# Patient Record
Sex: Male | Born: 1955 | Race: White | Hispanic: No | Marital: Married | Smoking: Former smoker
Health system: Southern US, Community
[De-identification: ages and names within clinical notes are randomized; demographics above are authoritative.]

---

## 2014-10-15 ENCOUNTER — Inpatient Hospital Stay: Admit: 2014-10-15 | Payer: Self-pay | Source: Other Acute Inpatient Hospital | Admitting: Internal Medicine

## 2015-12-15 ENCOUNTER — Encounter: Payer: Self-pay | Admitting: Gastroenterology

## 2016-03-18 ENCOUNTER — Encounter: Payer: Self-pay | Admitting: Sports Medicine

## 2016-03-18 ENCOUNTER — Encounter (INDEPENDENT_AMBULATORY_CARE_PROVIDER_SITE_OTHER): Payer: Self-pay

## 2016-03-18 ENCOUNTER — Ambulatory Visit (INDEPENDENT_AMBULATORY_CARE_PROVIDER_SITE_OTHER): Payer: PRIVATE HEALTH INSURANCE

## 2016-03-18 ENCOUNTER — Ambulatory Visit (INDEPENDENT_AMBULATORY_CARE_PROVIDER_SITE_OTHER): Payer: PRIVATE HEALTH INSURANCE | Admitting: Sports Medicine

## 2016-03-18 DIAGNOSIS — M79671 Pain in right foot: Secondary | ICD-10-CM

## 2016-03-18 DIAGNOSIS — M779 Enthesopathy, unspecified: Secondary | ICD-10-CM

## 2016-03-18 DIAGNOSIS — M2041 Other hammer toe(s) (acquired), right foot: Secondary | ICD-10-CM

## 2016-03-18 MED ORDER — TRIAMCINOLONE ACETONIDE 40 MG/ML IJ SUSP: 20.0000 mg | Freq: Once | INTRAMUSCULAR | Status: AC

## 2016-03-18 NOTE — Progress Notes (Addendum)
Subjective: John Nash is a 60 y.o. male patient who presents to office for evaluation of Right foot pain. Patient complains of progressive pain especially since end of June. Reports that it feels like he is walking on a marble and that there is stiffness to toes. Patient states that pain is worse with walking on hard surfaces. States that he has tried pulsating shower, using his air hose at work, change in shoes and some pads with no relief in symptoms. Patient also admits to a history of dropping things on foot however, no recent trauma or injury that he can recall that could be contributing to this pain. Patient denies any other pedal complaints.   There are no active problems to display for this patient.   No current outpatient prescriptions on file prior to visit.   No current facility-administered medications on file prior to visit.     No Known Allergies  Objective:  General: Alert and oriented x3 in no acute distress  Dermatology: No Callus or open lesions bilateral lower extremities, no webspace macerations, no ecchymosis bilateral, all nails x 10 are well manicured.  Vascular: Dorsalis Pedis and Posterior Tibial pedal pulses 2/4, Capillary Fill Time 3 seconds,(+) pedal hair growth bilateral, no edema bilateral lower extremities, Temperature gradient within normal limits.  Neurology: [name] sensation intact via light touch bilateral, negative Tinels sign bilateral.   Musculoskeletal: Semi-flexible hammertoes 2-5 with the most contracted digit being the right second toe with Mild tenderness with palpation at metatarsal phalangeal joint plantar aspect. Ankle, Subtalar, Midtarsal, and MTPJ joint range of motion is within normal limits, there is no 1st ray hypermobility noted bilateral, No bunion deformity noted bilateral. No pain with calf compression bilateral.  Strength within normal limits in all groups bilateral.   Xrays  Right Foot    Impression: Normal osseous  mineralization. There is hammertoe deformity with 2nd hammertoe being most contracted. There is posterior and inferior calcaneal spurs. Supinated position of foot. Soft tissue margins within normal limits. No other acute findings.       Assessment and Plan: Problem List Items Addressed This Visit    None    Visit Diagnoses    Capsulitis    -  Primary   Relevant Medications   triamcinolone acetonide (KENALOG-40) injection 20 mg (Start on 03/18/2016 10:45 AM)   Right foot pain       Relevant Medications   triamcinolone acetonide (KENALOG-40) injection 20 mg (Start on 03/18/2016 10:45 AM)   Other Relevant Orders   DG Foot 2 Views Right   Hammertoe, right       Relevant Medications   triamcinolone acetonide (KENALOG-40) injection 20 mg (Start on 03/18/2016 10:45 AM)       -Complete examination performed -Xrays reviewed -Discussed treatement options for likely capsulitis secondary to hammertoe deformity -After oral consent and aseptic prep, injected a mixture containing 1 ml of 2%  plain lidocaine, 1 ml 0.5% plain marcaine, 0.5 ml of kenalog 40 and 0.5 ml of dexamethasone phosphate into right second metatarsophalangeal joint dorsal approach without complication. Post-injection care discussed with patient.  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 Advised patient if this works will benefit long-term from orthotic management -Recommend icing daily -Recommend good supportive shoes -Patient to return to office as needed or sooner if condition worsens.  Landis Martins, DPM

## 2016-11-05 ENCOUNTER — Ambulatory Visit (INDEPENDENT_AMBULATORY_CARE_PROVIDER_SITE_OTHER): Payer: PRIVATE HEALTH INSURANCE | Admitting: Sports Medicine

## 2016-11-05 ENCOUNTER — Ambulatory Visit (INDEPENDENT_AMBULATORY_CARE_PROVIDER_SITE_OTHER): Payer: PRIVATE HEALTH INSURANCE

## 2016-11-05 ENCOUNTER — Encounter: Payer: Self-pay | Admitting: Sports Medicine

## 2016-11-05 VITALS — BP 115/84 | HR 78 | Resp 18

## 2016-11-05 DIAGNOSIS — M7671 Peroneal tendinitis, right leg: Secondary | ICD-10-CM

## 2016-11-05 DIAGNOSIS — R52 Pain, unspecified: Secondary | ICD-10-CM

## 2016-11-05 DIAGNOSIS — M79671 Pain in right foot: Secondary | ICD-10-CM

## 2016-11-05 DIAGNOSIS — M7751 Other enthesopathy of right foot: Secondary | ICD-10-CM | POA: Diagnosis not present

## 2016-11-05 DIAGNOSIS — M779 Enthesopathy, unspecified: Secondary | ICD-10-CM

## 2016-11-05 DIAGNOSIS — M7661 Achilles tendinitis, right leg: Secondary | ICD-10-CM

## 2016-11-05 MED ORDER — TRIAMCINOLONE ACETONIDE 10 MG/ML IJ SUSP: 10.0000 mg | Freq: Once | INTRAMUSCULAR | Status: AC

## 2016-11-05 NOTE — Progress Notes (Signed)
Subjective: John Nash is a 61 y.o. male patient who returns to office for evaluation of Right foot pain. Patient states new pain at base of 5th metatarsal that is bothering him. Denies injury. Patient denies any other pedal complaints.   There are no active problems to display for this patient.   Current Outpatient Prescriptions on File Prior to Visit  Medication Sig Dispense Refill  . ALPRAZolam (XANAX) 0.25 MG tablet TK 1 T PO BID  2  . ALPRAZolam (XANAX) 0.25 MG tablet Take 0.5 mg by mouth.    . B Complex Vitamins (B-COMPLEX/B-12 PO) Take 1,000 mg by mouth.    . Cholecalciferol (VITAMIN D-1000 MAX ST) 1000 units tablet Take by mouth.    . Cyanocobalamin (RA VITAMIN B-12 TR) 1000 MCG TBCR Take by mouth.    . ezetimibe (ZETIA) 10 MG tablet Take 20 mg by mouth.     . Multiple Vitamins-Minerals (CENTRUM SILVER 50+MEN PO) Take by mouth.    . rivaroxaban (XARELTO) 20 MG TABS tablet Take 20 mg by mouth daily with supper.     Current Facility-Administered Medications on File Prior to Visit  Medication Dose Route Frequency Provider Last Rate Last Dose  . triamcinolone acetonide (KENALOG-40) injection 20 mg  20 mg Other Once John Nash, DPM        No Known Allergies  Objective:  General: Alert and oriented x3 in no acute distress  Dermatology: No Callus or open lesions bilateral lower extremities, no webspace macerations, no ecchymosis bilateral, all nails x 10 are well manicured.  Vascular: Dorsalis Pedis and Posterior Tibial pedal pulses 2/4, Capillary Fill Time 3 seconds,(+) pedal hair growth bilateral, no edema bilateral lower extremities, Temperature gradient within normal limits.  Neurology: John Nash sensation intact via light touch bilateral, negative Tinels sign bilateral.   Musculoskeletal: Semi-flexible hammertoes 2-5 with the most contracted digit being the right second toe with Mild tenderness with palpation at base of 5th met on right. Ankle, Subtalar, Midtarsal, and  MTPJ joint range of motion is within normal limits, there is no 1st ray hypermobility noted bilateral, No bunion deformity noted bilateral. No pain with calf compression bilateral.  Strength within normal limits in all groups bilateral.         Assessment and Plan: Problem List Items Addressed This Visit    None    Visit Diagnoses    Pain    -  Primary   Relevant Medications   triamcinolone acetonide (KENALOG) 10 MG/ML injection 10 mg   Other Relevant Orders   DG Foot Complete Right   Capsulitis       Relevant Medications   triamcinolone acetonide (KENALOG) 10 MG/ML injection 10 mg   Right foot pain       Relevant Medications   triamcinolone acetonide (KENALOG) 10 MG/ML injection 10 mg   Peroneal tendonitis, right       Relevant Medications   triamcinolone acetonide (KENALOG) 10 MG/ML injection 10 mg      -Complete examination performed -Precious Xrays reviewed -Discussed treatement options for likely capsulitis secondary to tendonitis -After oral consent and aseptic prep, injected a mixture containing 1 ml of 2%  plain lidocaine, 1 ml 0.5% plain marcaine, 0.5 ml of kenalog 40 and 0.5 ml of dexamethasone phosphate into right 5th met base approach without complication. Post-injection care discussed with patient.  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 Advised patient if this works will benefit long-term from orthotic management -Recommend icing daily -Recommend good supportive shoes -Patient to return to office as needed or sooner if condition worsens.  Landis Martins, DPM

## 2017-04-04 ENCOUNTER — Encounter: Payer: Self-pay | Admitting: Internal Medicine

## 2017-04-04 ENCOUNTER — Ambulatory Visit (INDEPENDENT_AMBULATORY_CARE_PROVIDER_SITE_OTHER): Payer: BLUE CROSS/BLUE SHIELD | Admitting: Internal Medicine

## 2017-04-04 VITALS — BP 144/74 | HR 83 | Ht 74.0 in | Wt 232.0 lb

## 2017-04-04 DIAGNOSIS — R0609 Other forms of dyspnea: Secondary | ICD-10-CM

## 2017-04-04 DIAGNOSIS — R0600 Dyspnea, unspecified: Secondary | ICD-10-CM | POA: Insufficient documentation

## 2017-04-04 DIAGNOSIS — R911 Solitary pulmonary nodule: Secondary | ICD-10-CM

## 2017-04-04 DIAGNOSIS — I871 Compression of vein: Secondary | ICD-10-CM | POA: Diagnosis not present

## 2017-04-04 NOTE — Assessment & Plan Note (Signed)
Spirometry 04/04/2017  FEV1 2.27 (56%)  Ratio 72 with very minimal curvature on no rx with large airway "rhonchi" on exam c/w extrinsic compression of LMSB on CT from 03/31/17    The "wheezing" heard on exam is much more prominent over the L chest and is described as positional by pt and wife so nothing to be gained at this point with inhalers as overall has no evidence of airflow obstruction

## 2017-04-04 NOTE — Assessment & Plan Note (Addendum)
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 any kind of lung or node bx in the chest and he is on xarelto, surrendipitously also has filter, so will need to be off for 2-3 days before bx can be considered > liver looks like best bet for now pending PET  Discussed in detail all the  indications, usual  risks and alternatives  relative to the benefits with patient who agrees to proceed with w/u as outlined     Total time devoted to counseling  > 50 % of initial 60 min office visit:  review case with pt/wife discussion of options/alternatives/ personally creating written customized instructions  in presence of pt  then going over those specific  Instructions directly with the pt including how to use all of the meds but in particular covering each new medication in detail and the difference between the maintenance= "automatic" meds and the prns using an action plan format for the latter (If this problem/symptom => do that organization reading Left to right).  Please see AVS from this visit for a full list of these instructions which I personally wrote for this pt and  are unique to this visit.

## 2017-04-04 NOTE — Patient Instructions (Addendum)
Please see patient coordinator before you leave today  to schedule PET scan  asap and I will call you with the results to plan the next step

## 2017-04-04 NOTE — Progress Notes (Signed)
Subjective:     Patient ID: John Nash, male   DOB: Aug 10, 1955,    MRN: [id number]  HPI  32 yowm quit smoking 2002 p grand-daughter told him to while still perfectly healthy  then PE 2016 presented with sob pre-syncope dx with saddle embolism and familial clotting problem  (? 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 Pulmonary Consult    Referred by Dr. Dorothea Glassman. Pt c/o DOE x 2 months- occurs with exertion such as walking a brisk pace or taking a shower. He also c/o non prod cough. He hears some wheezing at night that comes and goes.   had dvt both legs on u/s @ dx of PE s assoc symptoms and underwent ivc filter at original dx of pe and has had intermittent bilateral  leg swelling since then no worse lateley with main cc = progressively worse  doe = MMRC3 = can't walk 100 yards even at a slow pace at a flat grade s stopping due to sob  Assoc with dry cough and audible noct wheeze intermittently / has not tried any inhalers/ wheezing seems positional  No obvious day to day or daytime variability or assoc excess/ purulent sputum or mucus plugs or hemoptysis or cp or chest tightness, subjective wheeze or overt sinus or hb symptoms. No unusual exp hx or h/o childhood pna/ asthma or knowledge of premature birth.  Sleeping ok flat without nocturnal  or early am exacerbation  of respiratory  c/o's or need for noct saba. Also denies any obvious fluctuation of symptoms with weather or environmental changes or other aggravating or alleviating factors  except as outlined above   Current Allergies, Complete Past Medical History, Past Surgical History, Family History, and Social History were reviewed in [employer] record.  ROS  The following are not active complaints unless bolded sore throat, dysphagia, dental problems, itching, sneezing,  nasal congestion or disharge of excess mucus or purulent secretions, ear ache,   fever, chills, sweats, unintended wt loss or wt gain, classically pleuritic or exertional cp,  orthopnea pnd or leg swelling, presyncope, palpitations, abdominal pain, anorexia, nausea, vomiting, diarrhea  or change in bowel habits or bladder habits, change in stools or change in urine, dysuria, hematuria,  rash, arthralgias, visual complaints, headache, numbness, weakness or ataxia or problems with walking or coordination,  change in mood/affect or memory.        Current Meds  Medication Sig  . acetaminophen (TYLENOL) 325 MG tablet Take 650 mg by mouth every 6 (six) hours as needed.  . ALPRAZolam (XANAX) 0.25 MG tablet Take 0.5 mg by mouth 2 (two) times daily as needed.   . cyanocobalamin 1000 MCG tablet Take 1,000 mcg by mouth daily.  Marland Kitchen ezetimibe (ZETIA) 10 MG tablet Take 10 mg by mouth daily.   . Multiple Vitamins-Minerals (CENTRUM SILVER 50+MEN PO) Take by mouth.  . rivaroxaban (XARELTO) 20 MG TABS tablet Take 20 mg by mouth daily with supper.   Current Facility-Administered Medications for the  04/04/17 encounter (Office Visit) with Tanda Rockers, MD  Medication  . triamcinolone acetonide (KENALOG) 10 MG/ML injection 10 mg  . triamcinolone acetonide (KENALOG-40) injection 20 mg         Review of Systems     Objective:   Physical Exam    amb wm nad   Wt Readings from Last 3 Encounters:  04/04/17 232 lb (105.2 kg)    Vital signs reviewed - Note on arrival 02 sats  98% on RA    HEENT: nl dentition, turbinates bilaterally, and oropharynx. Nl external ear canals without cough  reflex   NECK :  without JVD/Nodes/TM/ nl carotid upstrokes bilaterally - very prominent sts base of neck R = L    LUNGS: no acc muscle use,  Nl contour chest with coarse large airway sounding insp /exp rhonchi L >> R   CV:  RRR  no s3 or murmur or increase in P2, and trace pitting sym LE edema bilaterally   ABD:  soft and nontender with nl inspiratory excursion in the supine position. No bruits or organomegaly appreciated, bowel sounds nl  MS:  Nl gait/ ext warm without deformities, calf tenderness, cyanosis  - No clubbing No obvious joint restrictions   SKIN: warm and dry without lesions    NEURO:  alert, approp, nl sensorium with  no motor or cerebellar deficits apparent.      I personally reviewed images and agree with radiology impression as follows:   Chest CT 03/31/17  Bilateral hilar adenopathy with compression of LMSB and MPNS and Multiple liver nodules      Assessment:

## 2017-04-12 ENCOUNTER — Telehealth: Payer: Self-pay | Admitting: Internal Medicine

## 2017-04-12 NOTE — Telephone Encounter (Signed)
Rec'd cancer asssesment from from [facility] via fax - fwd to [employer] via interoffice mail -04/12/17-pr

## 2017-04-13 ENCOUNTER — Ambulatory Visit (HOSPITAL_COMMUNITY)
Admission: RE | Admit: 2017-04-13 | Discharge: 2017-04-13 | Disposition: A | Discharge status: Home / Self Care | Payer: BLUE CROSS/BLUE SHIELD | Source: Ambulatory Visit | Attending: Internal Medicine | Admitting: Internal Medicine

## 2017-04-13 DIAGNOSIS — C7951 Secondary malignant neoplasm of bone: Secondary | ICD-10-CM | POA: Diagnosis not present

## 2017-04-13 DIAGNOSIS — R918 Other nonspecific abnormal finding of lung field: Secondary | ICD-10-CM | POA: Insufficient documentation

## 2017-04-13 DIAGNOSIS — R590 Localized enlarged lymph nodes: Secondary | ICD-10-CM | POA: Diagnosis not present

## 2017-04-13 DIAGNOSIS — I871 Compression of vein: Secondary | ICD-10-CM | POA: Diagnosis not present

## 2017-04-13 DIAGNOSIS — C787 Secondary malignant neoplasm of liver and intrahepatic bile duct: Secondary | ICD-10-CM | POA: Diagnosis not present

## 2017-04-13 DIAGNOSIS — C801 Malignant (primary) neoplasm, unspecified: Secondary | ICD-10-CM | POA: Insufficient documentation

## 2017-04-13 DIAGNOSIS — R911 Solitary pulmonary nodule: Secondary | ICD-10-CM

## 2017-04-13 LAB — GLUCOSE, CAPILLARY: GLUCOSE-CAPILLARY: 98 mg/dL (ref 65–99)

## 2017-04-13 MED ORDER — FLUDEOXYGLUCOSE F - 18 (FDG) INJECTION
11.8000 | Freq: Once | INTRAVENOUS | Status: AC
  Administered 2017-04-13: 11.8 via INTRAVENOUS

## 2017-04-15 ENCOUNTER — Telehealth: Payer: Self-pay | Admitting: Internal Medicine

## 2017-04-15 DIAGNOSIS — I871 Compression of vein: Secondary | ICD-10-CM

## 2017-04-15 NOTE — Progress Notes (Signed)
Order sent to [facility] 

## 2017-04-15 NOTE — Telephone Encounter (Signed)
Note plan was to order bx as of 04/13/17 but we had to [facility] . Try again

## 2017-04-15 NOTE — Telephone Encounter (Signed)
Spoke with pt's wife and she states she has not heard about an appt for a biopsy for pt. She states MW wanted this done as soon as possible but they haven't heard anything. MW was this ordered? I don't see the order for this. Please advise.

## 2017-04-15 NOTE — Telephone Encounter (Signed)
Biopsy ordered STAT and pt's spouse made aware  She was advised that the pt can take xarelto today and tomorrow

## 2017-04-18 NOTE — Telephone Encounter (Signed)
Rec'd disability forms ([employer]) from [employer] - fwd to [place] for MW to complete -pr

## 2017-04-19 ENCOUNTER — Other Ambulatory Visit: Payer: Self-pay | Admitting: Student

## 2017-04-19 ENCOUNTER — Other Ambulatory Visit: Payer: Self-pay | Admitting: General Surgery

## 2017-04-20 ENCOUNTER — Ambulatory Visit (HOSPITAL_COMMUNITY)
Admission: RE | Admit: 2017-04-20 | Discharge: 2017-04-20 | Disposition: A | Discharge status: Home / Self Care | Payer: BLUE CROSS/BLUE SHIELD | Source: Ambulatory Visit | Attending: Internal Medicine | Admitting: Internal Medicine

## 2017-04-20 ENCOUNTER — Encounter (HOSPITAL_COMMUNITY): Payer: Self-pay

## 2017-04-20 DIAGNOSIS — D689 Coagulation defect, unspecified: Secondary | ICD-10-CM | POA: Diagnosis not present

## 2017-04-20 DIAGNOSIS — C7A1 Malignant poorly differentiated neuroendocrine tumors: Secondary | ICD-10-CM | POA: Diagnosis not present

## 2017-04-20 DIAGNOSIS — R590 Localized enlarged lymph nodes: Secondary | ICD-10-CM | POA: Insufficient documentation

## 2017-04-20 DIAGNOSIS — Z87891 Personal history of nicotine dependence: Secondary | ICD-10-CM | POA: Insufficient documentation

## 2017-04-20 DIAGNOSIS — R221 Localized swelling, mass and lump, neck: Secondary | ICD-10-CM | POA: Insufficient documentation

## 2017-04-20 DIAGNOSIS — C7B00 Secondary carcinoid tumors, unspecified site: Secondary | ICD-10-CM | POA: Insufficient documentation

## 2017-04-20 DIAGNOSIS — C787 Secondary malignant neoplasm of liver and intrahepatic bile duct: Secondary | ICD-10-CM | POA: Diagnosis not present

## 2017-04-20 DIAGNOSIS — R0602 Shortness of breath: Secondary | ICD-10-CM | POA: Insufficient documentation

## 2017-04-20 DIAGNOSIS — I871 Compression of vein: Secondary | ICD-10-CM | POA: Diagnosis present

## 2017-04-20 DIAGNOSIS — R918 Other nonspecific abnormal finding of lung field: Secondary | ICD-10-CM | POA: Insufficient documentation

## 2017-04-20 LAB — CBC
HEMATOCRIT: 41.2 % (ref 39.0–52.0)
HEMOGLOBIN: 13.1 g/dL (ref 13.0–17.0)
MCH: 26.5 pg (ref 26.0–34.0)
MCHC: 31.8 g/dL (ref 30.0–36.0)
MCV: 83.2 fL (ref 78.0–100.0)
Platelets: 145 10*3/uL — ABNORMAL LOW (ref 150–400)
RBC: 4.95 MIL/uL (ref 4.22–5.81)
RDW: 13.2 % (ref 11.5–15.5)
WBC: 5.8 10*3/uL (ref 4.0–10.5)

## 2017-04-20 LAB — PROTIME-INR
INR: 0.95
PROTHROMBIN TIME: 12.6 s (ref 11.4–15.2)

## 2017-04-20 MED ORDER — MIDAZOLAM HCL 2 MG/2ML IJ SOLN
INTRAMUSCULAR | Status: AC
  Filled 2017-04-20: qty 2

## 2017-04-20 MED ORDER — SODIUM CHLORIDE 0.9 % IV SOLN
INTRAVENOUS | Status: DC
  Administered 2017-04-20: 13:00:00 via INTRAVENOUS

## 2017-04-20 MED ORDER — MIDAZOLAM HCL 2 MG/2ML IJ SOLN
INTRAMUSCULAR | Status: AC | PRN
  Administered 2017-04-20: 1 mg via INTRAVENOUS

## 2017-04-20 MED ORDER — LIDOCAINE HCL (PF) 1 % IJ SOLN
INTRAMUSCULAR | Status: AC
  Filled 2017-04-20: qty 10

## 2017-04-20 MED ORDER — FENTANYL CITRATE (PF) 100 MCG/2ML IJ SOLN
INTRAMUSCULAR | Status: AC
  Filled 2017-04-20: qty 2

## 2017-04-20 MED ORDER — FENTANYL CITRATE (PF) 100 MCG/2ML IJ SOLN
INTRAMUSCULAR | Status: AC | PRN
Start: 2017-04-20 — End: 2017-04-20
  Administered 2017-04-20 (×2): 50 ug via INTRAVENOUS

## 2017-04-20 NOTE — Sedation Documentation (Signed)
Patient is resting comfortably. 

## 2017-04-20 NOTE — Procedures (Signed)
Interventional Radiology Procedure Note  Procedure: US guided core biopsy of right supraclavicular lymph node  Complications: None  Estimated Blood Loss: < 10 mL  Enlarged right supraclavicular LN sampled with 18 G core biopsy x 4.  Samples sent in saline.  Venetia Night. Kathlene Cote, M.D Pager:  [phone]

## 2017-04-20 NOTE — Sedation Documentation (Signed)
Patient is resting comfortably. Denies pain 0/10

## 2017-04-20 NOTE — Discharge Instructions (Signed)

## 2017-04-20 NOTE — H&P (Signed)
Chief Complaint: Patient was seen in consultation today for lymphadenopathy  Referring Physician(s): Wert,Michael B  Supervising Physician: Aletta Edouard  Patient Status: [facility] - Out-pt  History of Present Illness: John Nash is a 61 y.o. 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  CT scan performed at OSH shows lymphadenopathy and lung nodules.   PET 04/13/17: 1. Examination is positive for extensive hypermetabolic adenopathy within the neck, chest, and upper abdomen. 2. Hypermetabolic pulmonary nodules are identified within the left upper lobe. 3. Extensive liver metastasis 4. Diffuse hypermetabolic bone metastasis.   IR consulted for possible biopsy at the request of Dr. Melvyn Novas.  Case reviewed and approved for cervical lymph node biopsy by Dr. Earleen Newport.   Patient presents for procedure today in his usual state of health.  Patient has been NPO.  He does take Xarelto but has held it for the past 2 days.   History reviewed. No pertinent past medical history.  History reviewed. No pertinent surgical history.  Allergies: Statins  Medications: Prior to Admission medications   Medication Sig Start Date End Date Taking? Authorizing Provider  acetaminophen (TYLENOL) 500 MG tablet Take 1,000 mg by mouth every 6 (six) hours as needed (FOR PAIN.).   Yes [provider]  ALPRAZolam Duanne Moron) 0.5 MG tablet Take 0.5 mg by mouth 3 (three) times daily as needed for anxiety.   Yes [provider]  cyanocobalamin (,VITAMIN B-12,) 1000 MCG/ML injection Inject 1,000 mcg into the muscle every 30 (thirty) days.   Yes [provider]  cyanocobalamin 1000 MCG tablet Take 1,000 mcg by mouth daily.   Yes [provider]  ezetimibe (ZETIA) 10 MG tablet Take 10 mg by mouth daily.    Yes [provider]  Menthol,  Topical Analgesic, (BLUE-EMU MAXIMUM STRENGTH EX) Apply 1 application topically 3 (three) times daily as needed (for back pain.).   Yes [provider]  Menthol, Topical Analgesic, (ICY HOT EX) Apply 1-2 application topically 3 (three) times daily as needed (for back pain.).   Yes [provider]  Multiple Vitamin (MULTIVITAMIN WITH MINERALS) TABS tablet Take 1 tablet by mouth daily. CENTRUM SILVER   Yes [provider]  oxyCODONE-acetaminophen (PERCOCET/ROXICET) 5-325 MG tablet Take 1-2 tablets by mouth every 6 (six) hours as needed (for pain.).   Yes [provider]  rivaroxaban (XARELTO) 20 MG TABS tablet Take 20 mg by mouth daily with supper.   Yes [provider]     History reviewed. No pertinent family history.  Social History   Social History  . Marital status: Married    Spouse name: N/A  . Number of children: N/A  . Years of education: N/A   Social History Main Topics  . Smoking status: Former Smoker    Packs/day: 1.00    Years: 30.00    Types: Cigarettes    Quit date: 07/19/2000  . Smokeless tobacco: Never Used  . Alcohol use None  . Drug use: Unknown  . Sexual activity: Not Asked   Other Topics Concern  . None   Social History Narrative  . None    Review of Systems  Constitutional: Negative for fatigue and fever.  Respiratory: Positive for cough (chronic) and shortness of breath (chronic).   Cardiovascular: Negative for chest pain.  Gastrointestinal: Negative for abdominal pain.  Hematological: Positive for adenopathy.  Psychiatric/Behavioral: Negative for  behavioral problems and confusion.    Vital Signs: BP (!) 172/92 (BP Location: Right Arm)   Pulse 84   Temp 98.7 F (37.1 C) (Oral)   Ht 6\' 2"  (1.88 m)   Wt 226 lb (102.5 kg)   SpO2 96%   BMI 29.02 kg/m   Physical Exam  Constitutional: He appears well-developed.  Neck:  Large area of swelling/enhancement superior to right clavicle.   Cardiovascular:  Normal rate, regular rhythm and normal heart sounds.   Pulmonary/Chest: Effort normal and breath sounds normal. No respiratory distress.  Abdominal: Soft.  Nursing note and vitals reviewed.   Imaging: Nm Pet Image Initial (pi) Skull Base To Thigh  Result Date: 04/13/2017 CLINICAL DATA:  Initial treatment strategy for pulmonary nodule. EXAM: NUCLEAR MEDICINE PET SKULL BASE TO THIGH TECHNIQUE: 11.8 mCi F-18 FDG was injected intravenously. Full-ring PET imaging was performed from the skull base to thigh after the radiotracer. CT data was obtained and used for attenuation correction and anatomic localization. FASTING BLOOD GLUCOSE:  Value: 98 mg/dl COMPARISON:  None. FINDINGS: NECK: Extensive hypermetabolic bilateral cervical adenopathy identified. Right posterior triangle node measures 1.5 cm and has an SUV max equal to 12.4. There is a left level 3 node which measures 1.2 cm and has an SUV max equal to 7.95, image 41 of series 4. CHEST: Extensive hypermetabolic mediastinal and bilateral hilar adenopathy. Index right paratracheal node measures 1.5 cm and has an SUV max equal to 18.95. Hypermetabolic left paratracheal node measures 1.7 cm and has an SUV max equal to 16.9. Sub- carinal lymph node mass measures 2.9 cm and has an SUV max equal to 26.37. Index left hilar mass measures 4.7 cm and has an SUV max equal to 18.08. Right hilar nodal mass measures 4.8 cm and has an SUV max equal to 20.5. Two nodules within the left upper lobe are identified and hypermetabolic. The largest measures 1.6 cm and has an SUV max equal to 15.2. ABDOMEN/PELVIS: Extensive hypermetabolic liver metastases are identified. Index lesion along the dome measures 4.4 cm and has an SUV max equal to 21. Confluent mass within the medial segment of the left lobe measures 4.8 cm and has an SUV max equal to 23.4. Multiple hypermetabolic nodes are identified within the upper abdomen. Index aortocaval node measures 1.1 cm and has an SUV max equal  to 23.16. Retrocaval node measures 0.8 cm and has an SUV max equal to 20.76. There is no abnormal activity within the pancreas or spleen. Unremarkable appearance of the kidneys. A filter is identified within the inferior vena cava. Aortic atherosclerosis without aneurysm. SKELETON: Extensive multifocal hypermetabolic bone metastases identified. Large lesion involving nearly the entire L3 vertebra has an SUV max equal to 18.66. Hypermetabolic lesion involving approximately 50% of the T7 vertebra has an SUV max equal to 9.11. Lesion within the right side of the sternal manubrium has an SUV max equal to 6.4. IMPRESSION: 1. Examination is positive for extensive hypermetabolic adenopathy within the neck, chest, and upper abdomen. 2. Hypermetabolic pulmonary nodules are identified within the left upper lobe. 3. Extensive liver metastasis 4. Diffuse hypermetabolic bone metastasis. Electronically Signed   By: Kerby Moors M.D.   On: 04/13/2017 10:58    Labs:  CBC:  Recent Labs  04/20/17 1137  WBC 5.8  HGB 13.1  HCT 41.2  PLT 145*    COAGS:  Recent Labs  04/20/17 1137  INR 0.95    BMP: No results for input(s): NA, K, CL, CO2, GLUCOSE, BUN,  CALCIUM, CREATININE, GFRNONAA, GFRAA in the last 8760 hours.  Invalid input(s): CMP  LIVER FUNCTION TESTS: No results for input(s): BILITOT, AST, ALT, ALKPHOS, PROT, ALBUMIN in the last 8760 hours.  TUMOR MARKERS: No results for input(s): AFPTM, CEA, CA199, CHROMGRNA in the last 8760 hours.  Assessment and Plan: Patient with past medical history of familial clotting disorder presents with complaint of shortness of breath and recent findings on CT scan suspicious for metastatic disease of lung primary.  IR consulted for lymph node biopsy at the request of Dr. Melvyn Novas. Case reviewed by Dr. Earleen Newport who approves patient for procedure.  Patient presents today in their usual state of health.  He has been NPO and has appropriately held his blood thinners.    Risks and benefits discussed with the patient including, but not limited to bleeding, infection, damage to adjacent structures or low yield requiring additional tests. All of the patient's questions were answered, patient is agreeable to proceed. Consent signed and in chart.    Thank you for this interesting consult.  I greatly enjoyed meeting John Nash and look forward to participating in their care.  A copy of this report was sent to the requesting provider on this date.  Electronically Signed: Docia Barrier, PA 04/20/2017, 12:31 PM   I spent a total of  30 Minutes   in face to face in clinical consultation, greater than 50% of which was counseling/coordinating care for lymphadenopathy

## 2017-04-25 ENCOUNTER — Other Ambulatory Visit: Payer: Self-pay | Admitting: Internal Medicine

## 2017-04-25 DIAGNOSIS — I871 Compression of vein: Secondary | ICD-10-CM

## 2017-04-25 NOTE — Progress Notes (Signed)
Discussed u/s bx dx with wife > oncology next stop but if condition worsens > go to [facility] ER

## 2017-04-26 NOTE — Telephone Encounter (Signed)
Rec'd completed forms back from [place] to [employer] via interoffice mail - 04/26/17 -pr

## 2017-04-28 DIAGNOSIS — Z0001 Encounter for general adult medical examination with abnormal findings: Secondary | ICD-10-CM

## 2017-04-28 DIAGNOSIS — C3490 Malignant neoplasm of unspecified part of unspecified bronchus or lung: Secondary | ICD-10-CM | POA: Diagnosis not present

## 2017-04-28 DIAGNOSIS — C7951 Secondary malignant neoplasm of bone: Secondary | ICD-10-CM | POA: Diagnosis not present

## 2017-04-28 DIAGNOSIS — C787 Secondary malignant neoplasm of liver and intrahepatic bile duct: Secondary | ICD-10-CM | POA: Diagnosis not present

## 2017-04-29 ENCOUNTER — Institutional Professional Consult (permissible substitution): Payer: Self-pay | Admitting: Internal Medicine

## 2017-05-09 DIAGNOSIS — Z0001 Encounter for general adult medical examination with abnormal findings: Secondary | ICD-10-CM | POA: Diagnosis not present

## 2017-07-13 DIAGNOSIS — Z006 Encounter for examination for normal comparison and control in clinical research program: Secondary | ICD-10-CM | POA: Diagnosis not present

## 2017-07-13 DIAGNOSIS — C3490 Malignant neoplasm of unspecified part of unspecified bronchus or lung: Secondary | ICD-10-CM | POA: Diagnosis not present

## 2017-07-13 DIAGNOSIS — C7951 Secondary malignant neoplasm of bone: Secondary | ICD-10-CM | POA: Diagnosis not present

## 2017-07-13 DIAGNOSIS — C787 Secondary malignant neoplasm of liver and intrahepatic bile duct: Secondary | ICD-10-CM | POA: Diagnosis not present

## 2017-08-05 DIAGNOSIS — Z006 Encounter for examination for normal comparison and control in clinical research program: Secondary | ICD-10-CM | POA: Diagnosis not present

## 2017-08-05 DIAGNOSIS — C7951 Secondary malignant neoplasm of bone: Secondary | ICD-10-CM | POA: Diagnosis not present

## 2017-08-05 DIAGNOSIS — C3490 Malignant neoplasm of unspecified part of unspecified bronchus or lung: Secondary | ICD-10-CM | POA: Diagnosis not present

## 2017-08-05 DIAGNOSIS — C787 Secondary malignant neoplasm of liver and intrahepatic bile duct: Secondary | ICD-10-CM | POA: Diagnosis not present

## 2017-08-22 DIAGNOSIS — C3490 Malignant neoplasm of unspecified part of unspecified bronchus or lung: Secondary | ICD-10-CM | POA: Diagnosis not present

## 2017-08-22 DIAGNOSIS — Z006 Encounter for examination for normal comparison and control in clinical research program: Secondary | ICD-10-CM | POA: Diagnosis not present

## 2017-08-22 DIAGNOSIS — C787 Secondary malignant neoplasm of liver and intrahepatic bile duct: Secondary | ICD-10-CM | POA: Diagnosis not present

## 2017-08-22 DIAGNOSIS — C7951 Secondary malignant neoplasm of bone: Secondary | ICD-10-CM | POA: Diagnosis not present

## 2017-09-05 DIAGNOSIS — C3490 Malignant neoplasm of unspecified part of unspecified bronchus or lung: Secondary | ICD-10-CM | POA: Diagnosis not present

## 2017-09-05 DIAGNOSIS — C787 Secondary malignant neoplasm of liver and intrahepatic bile duct: Secondary | ICD-10-CM | POA: Diagnosis not present

## 2017-09-05 DIAGNOSIS — C7951 Secondary malignant neoplasm of bone: Secondary | ICD-10-CM | POA: Diagnosis not present

## 2017-09-05 DIAGNOSIS — Z006 Encounter for examination for normal comparison and control in clinical research program: Secondary | ICD-10-CM | POA: Diagnosis not present

## 2017-09-19 DIAGNOSIS — C787 Secondary malignant neoplasm of liver and intrahepatic bile duct: Secondary | ICD-10-CM | POA: Diagnosis not present

## 2017-09-19 DIAGNOSIS — Z006 Encounter for examination for normal comparison and control in clinical research program: Secondary | ICD-10-CM | POA: Diagnosis not present

## 2017-09-19 DIAGNOSIS — C7951 Secondary malignant neoplasm of bone: Secondary | ICD-10-CM | POA: Diagnosis not present

## 2017-09-19 DIAGNOSIS — C3490 Malignant neoplasm of unspecified part of unspecified bronchus or lung: Secondary | ICD-10-CM | POA: Diagnosis not present

## 2017-10-14 DIAGNOSIS — Z006 Encounter for examination for normal comparison and control in clinical research program: Secondary | ICD-10-CM | POA: Diagnosis not present

## 2017-10-14 DIAGNOSIS — C3490 Malignant neoplasm of unspecified part of unspecified bronchus or lung: Secondary | ICD-10-CM | POA: Diagnosis not present

## 2017-10-14 DIAGNOSIS — C787 Secondary malignant neoplasm of liver and intrahepatic bile duct: Secondary | ICD-10-CM | POA: Diagnosis not present

## 2017-10-14 DIAGNOSIS — D701 Agranulocytosis secondary to cancer chemotherapy: Secondary | ICD-10-CM | POA: Diagnosis not present

## 2017-11-03 DIAGNOSIS — C3490 Malignant neoplasm of unspecified part of unspecified bronchus or lung: Secondary | ICD-10-CM | POA: Diagnosis not present

## 2017-11-03 DIAGNOSIS — Z006 Encounter for examination for normal comparison and control in clinical research program: Secondary | ICD-10-CM | POA: Diagnosis not present

## 2017-11-03 DIAGNOSIS — C787 Secondary malignant neoplasm of liver and intrahepatic bile duct: Secondary | ICD-10-CM | POA: Diagnosis not present

## 2017-11-24 DIAGNOSIS — C3490 Malignant neoplasm of unspecified part of unspecified bronchus or lung: Secondary | ICD-10-CM | POA: Diagnosis not present

## 2017-11-24 DIAGNOSIS — C787 Secondary malignant neoplasm of liver and intrahepatic bile duct: Secondary | ICD-10-CM | POA: Diagnosis not present

## 2017-12-15 DIAGNOSIS — R5383 Other fatigue: Secondary | ICD-10-CM | POA: Diagnosis not present

## 2017-12-15 DIAGNOSIS — C7951 Secondary malignant neoplasm of bone: Secondary | ICD-10-CM | POA: Diagnosis not present

## 2017-12-15 DIAGNOSIS — R630 Anorexia: Secondary | ICD-10-CM | POA: Diagnosis not present

## 2017-12-15 DIAGNOSIS — C787 Secondary malignant neoplasm of liver and intrahepatic bile duct: Secondary | ICD-10-CM | POA: Diagnosis not present

## 2017-12-15 DIAGNOSIS — C3490 Malignant neoplasm of unspecified part of unspecified bronchus or lung: Secondary | ICD-10-CM | POA: Diagnosis not present

## 2018-01-05 DIAGNOSIS — R531 Weakness: Secondary | ICD-10-CM | POA: Diagnosis not present

## 2018-01-05 DIAGNOSIS — C7951 Secondary malignant neoplasm of bone: Secondary | ICD-10-CM | POA: Diagnosis not present

## 2018-01-05 DIAGNOSIS — C3490 Malignant neoplasm of unspecified part of unspecified bronchus or lung: Secondary | ICD-10-CM | POA: Diagnosis not present

## 2018-01-05 DIAGNOSIS — Z92241 Personal history of systemic steroid therapy: Secondary | ICD-10-CM

## 2018-01-05 DIAGNOSIS — C787 Secondary malignant neoplasm of liver and intrahepatic bile duct: Secondary | ICD-10-CM | POA: Diagnosis not present

## 2018-01-05 DIAGNOSIS — R627 Adult failure to thrive: Secondary | ICD-10-CM

## 2018-01-26 DIAGNOSIS — C787 Secondary malignant neoplasm of liver and intrahepatic bile duct: Secondary | ICD-10-CM | POA: Diagnosis not present

## 2018-01-26 DIAGNOSIS — C3490 Malignant neoplasm of unspecified part of unspecified bronchus or lung: Secondary | ICD-10-CM | POA: Diagnosis not present

## 2018-01-26 DIAGNOSIS — R590 Localized enlarged lymph nodes: Secondary | ICD-10-CM | POA: Diagnosis not present

## 2018-01-26 DIAGNOSIS — C7951 Secondary malignant neoplasm of bone: Secondary | ICD-10-CM | POA: Diagnosis not present

## 2018-02-20 IMAGING — PT NM PET TUM IMG INITIAL (PI) SKULL BASE T - THIGH
7 of 8 series · 21 of 25 positions shown · non-contrast
Comparison: None.

CLINICAL DATA: Initial treatment strategy for pulmonary nodule.

EXAM:
NUCLEAR MEDICINE PET SKULL BASE TO THIGH
TECHNIQUE: 11.8 mCi F-18 FDG was injected intravenously. Full-ring PET imaging
was performed from the skull base to thigh after the radiotracer. CT
data was obtained and used for attenuation correction and anatomic
localization.
FASTING BLOOD GLUCOSE:  Value: 98 mg/dl

[Series 3: pet sk_thigh ac · axial · 5.0mm · 4.07mm/px · z∈[-988,-20]mm · 4 of 243 slices shown]
[im 1/243]
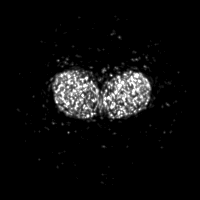
[im 61/243]
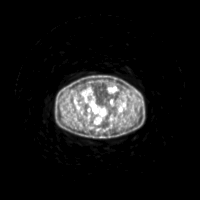
[im 122/243]
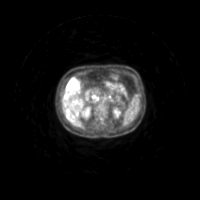
[im 243/243]
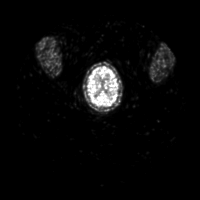

[Series 4: ct sk_thigh 5.0 b31f · axial · 5.0mm · 0.98mm/px · z∈[-988,-264]mm · 4 of 243 slices shown]
[im 1/243]
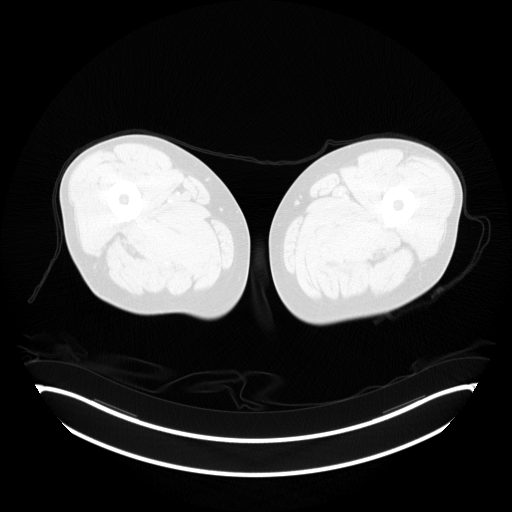
[im 61/243]
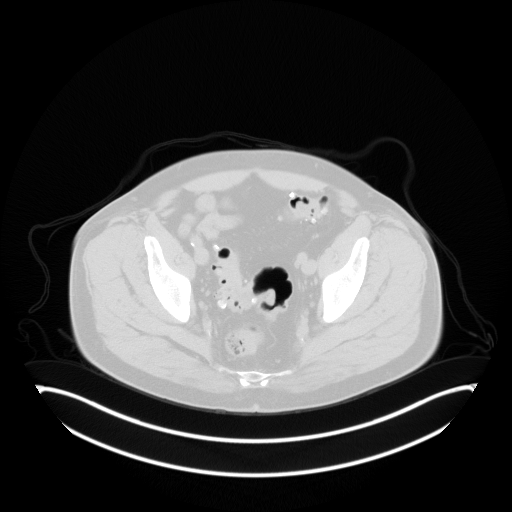
[im 122/243]
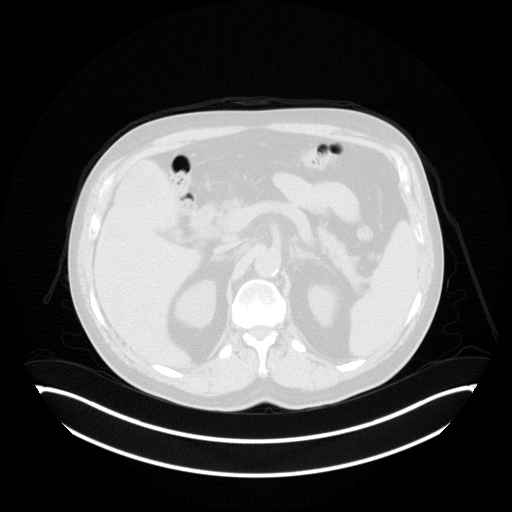
[im 182/243]
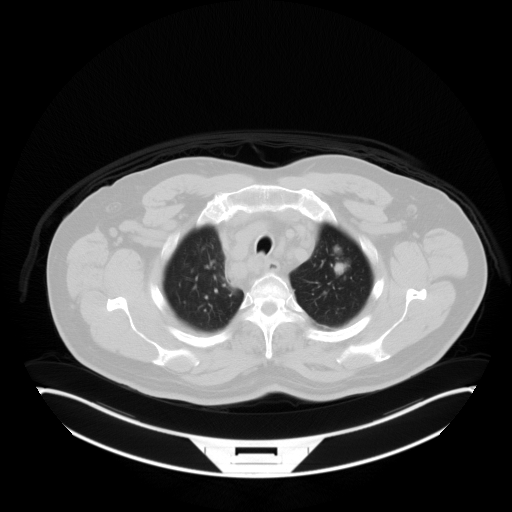

[Series 5: pet sk_thigh nac · axial · 5.0mm · 4.07mm/px · z∈[-988,-20]mm · 5 of 243 slices shown]
[im 1/243]
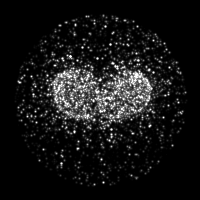
[im 61/243]
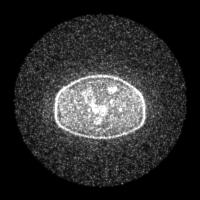
[im 122/243]
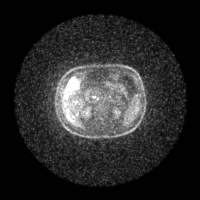
[im 182/243]
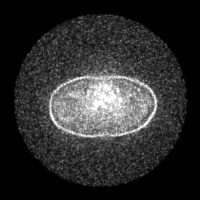
[im 243/243]
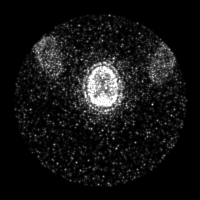

[Series 603: range-ct sk_thigh 5.0 (id)<alpha range> · 2 of 89 slices shown (1 of 2)]
[im 1/89]
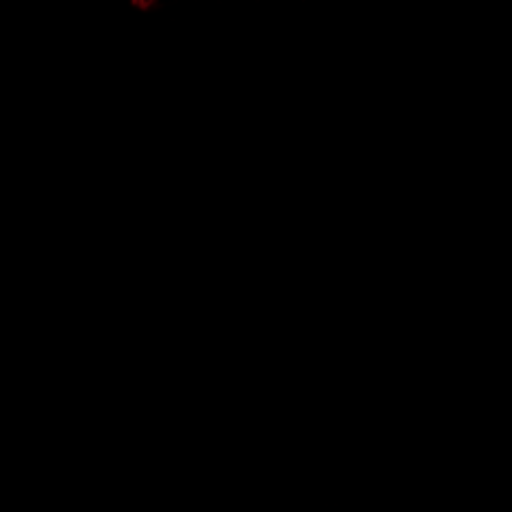
[im 89/89]
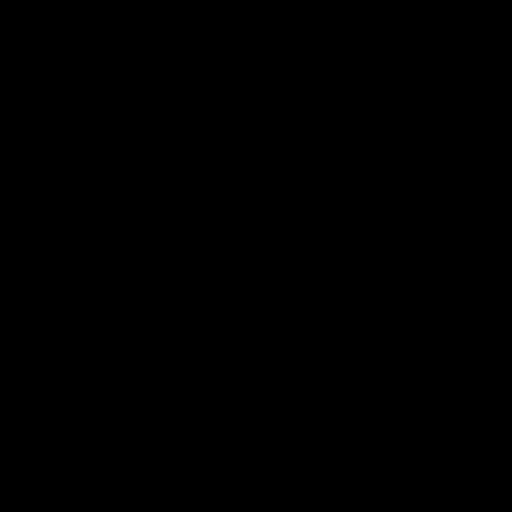

[Series 604: mip range · coronal · 2.01mm/px · 1 of 32 slices shown]
[im 1/32]
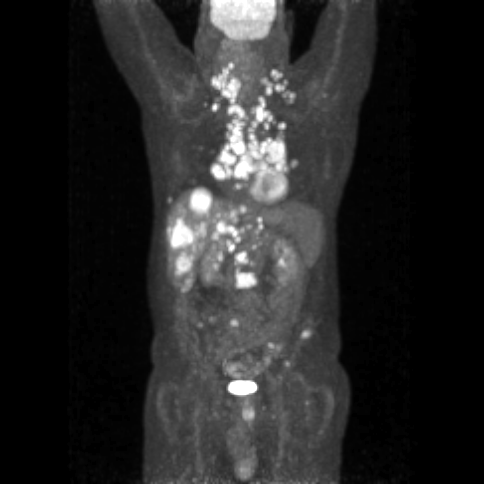

[Series 605: range-ct sk_thigh 5.0 (id)<alpha range> · 4 of 230 slices shown (2 of 2)]
[im 1/230]
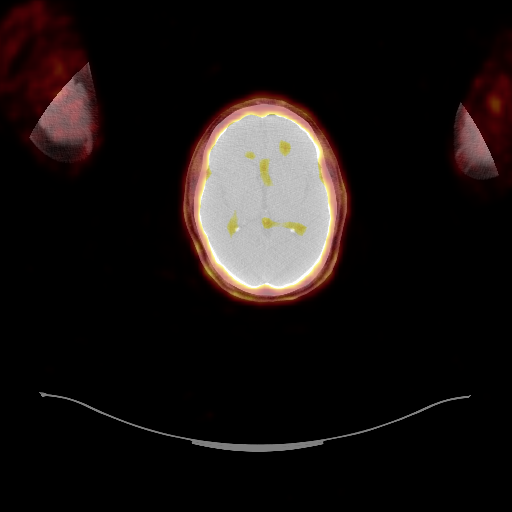
[im 58/230]
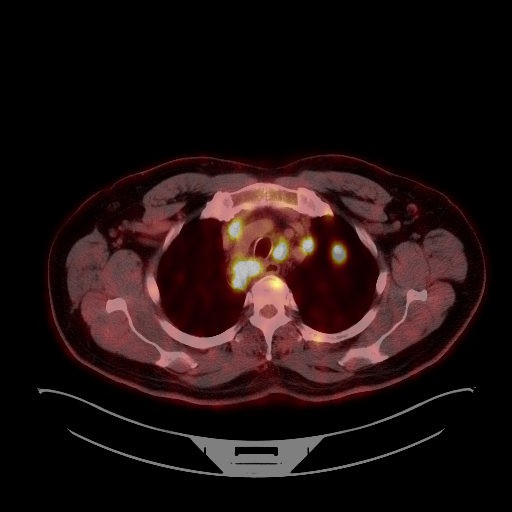
[im 172/230]
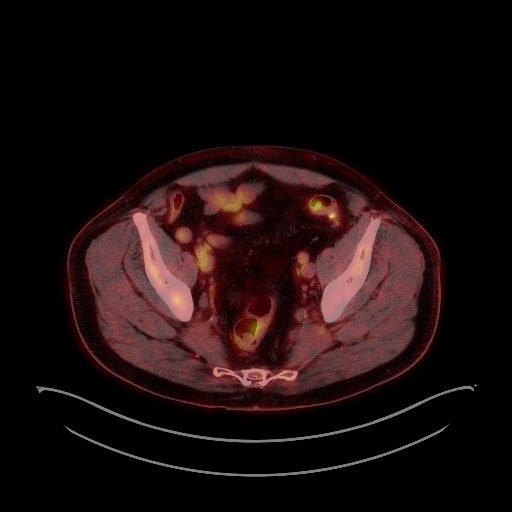
[im 230/230]
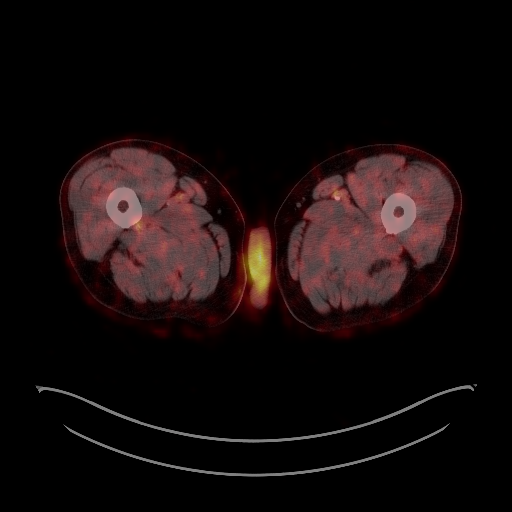

[Series 1032: results mm oncology reading · 0.50mm/px · 1 of 15 slices shown]
[im 1/15]
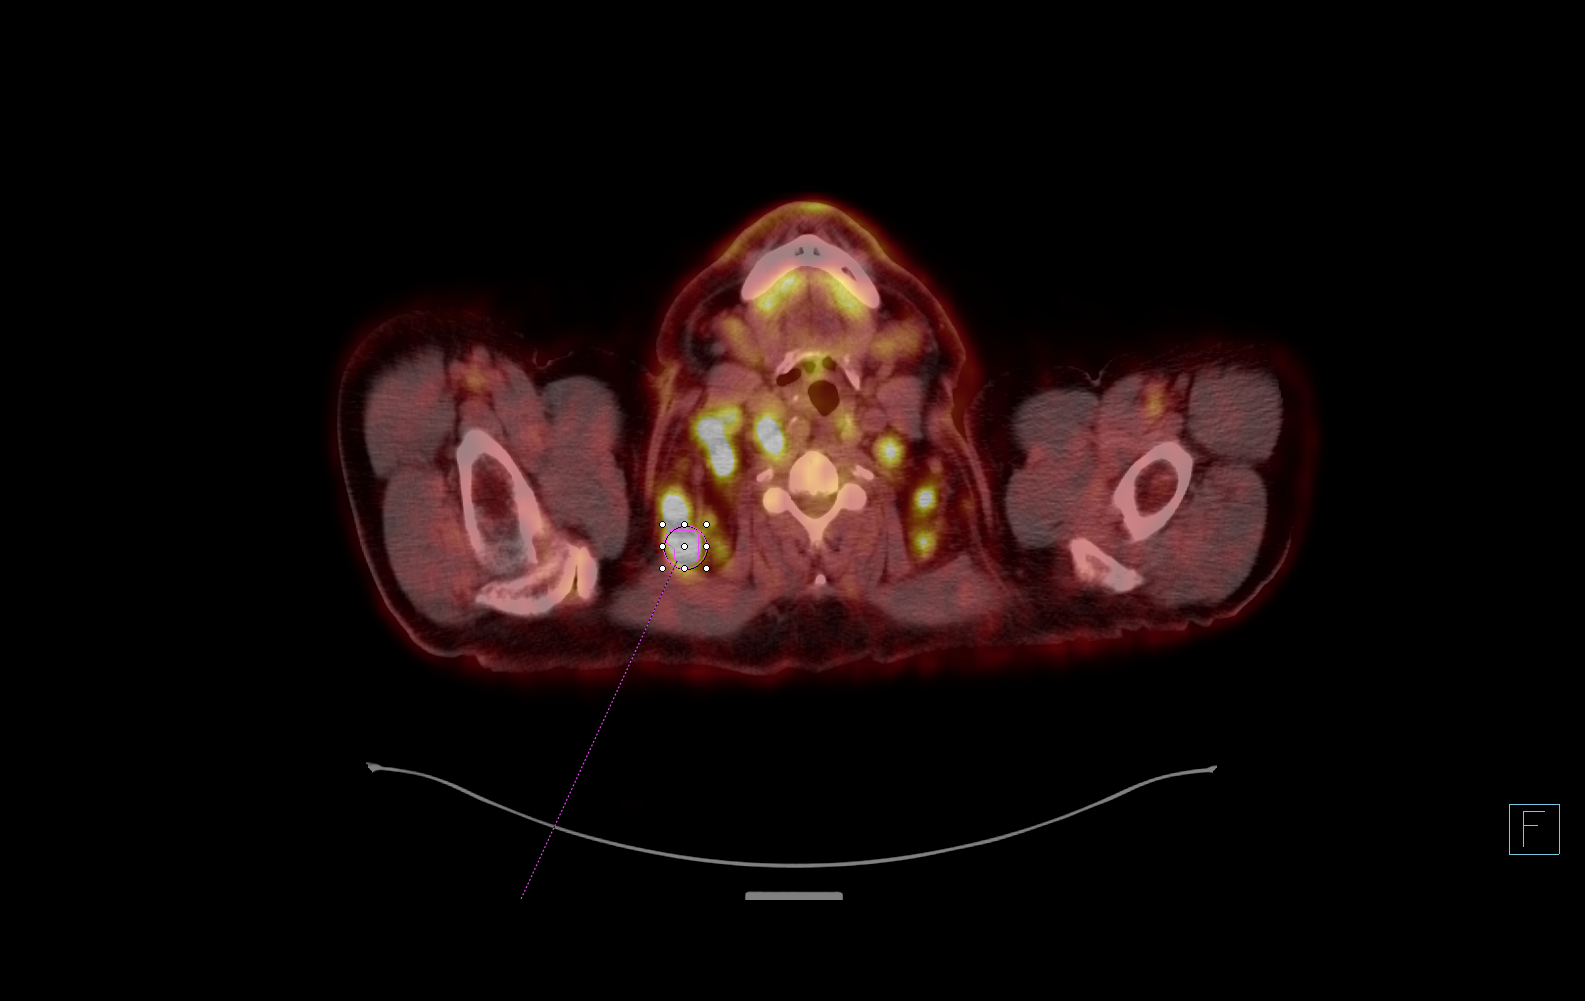

[21 of 25 positions shown; findings below may reference images not displayed]

FINDINGS: NECK: Extensive hypermetabolic bilateral cervical adenopathy
identified. Right posterior triangle node measures 1.5 cm and has an
SUV max equal to 12.4. There is a left level 3 node which measures
1.2 cm and has an SUV max equal to 7.95, image 41 of series 4.

CHEST: Extensive hypermetabolic mediastinal and bilateral hilar
adenopathy. Index right paratracheal node measures 1.5 cm and has an
SUV max equal to 18.95. Hypermetabolic left paratracheal node
measures 1.7 cm and has an SUV max equal to 16.9. Sub- carinal lymph
node mass measures 2.9 cm and has an SUV max equal to 26.37. Index
left hilar mass measures 4.7 cm and has an SUV max equal to 18.08.
Right hilar nodal mass measures 4.8 cm and has an SUV max equal to
20.5. Two nodules within the left upper lobe are identified and
hypermetabolic. The largest measures 1.6 cm and has an SUV max equal
to 15.2.

ABDOMEN/PELVIS: Extensive hypermetabolic liver metastases are
identified. Index lesion along the dome measures 4.4 cm and has an
SUV max equal to 21. Confluent mass within the medial segment of the
left lobe measures 4.8 cm and has an SUV max equal to 23.4.

Multiple hypermetabolic nodes are identified within the upper
abdomen. Index aortocaval node measures 1.1 cm and has an SUV max
equal to 23.16. Retrocaval node measures 0.8 cm and has an SUV max
equal to 20.76. There is no abnormal activity within the pancreas or
spleen. Unremarkable appearance of the kidneys. A filter is
identified within the inferior vena cava. Aortic atherosclerosis
without aneurysm.

SKELETON: Extensive multifocal hypermetabolic bone metastases
identified. Large lesion involving nearly the entire L3 vertebra has
an SUV max equal to 18.66. Hypermetabolic lesion involving
approximately 50% of the T7 vertebra has an SUV max equal to 9.11.
Lesion within the right side of the sternal manubrium has an SUV max
equal to 6.4.
IMPRESSION: 1. Examination is positive for extensive hypermetabolic adenopathy
within the neck, chest, and upper abdomen.
2. Hypermetabolic pulmonary nodules are identified within the left
upper lobe.
3. Extensive liver metastasis
4. Diffuse hypermetabolic bone metastasis.

## 2018-02-22 DIAGNOSIS — R590 Localized enlarged lymph nodes: Secondary | ICD-10-CM

## 2018-02-22 DIAGNOSIS — C787 Secondary malignant neoplasm of liver and intrahepatic bile duct: Secondary | ICD-10-CM

## 2018-02-22 DIAGNOSIS — C7951 Secondary malignant neoplasm of bone: Secondary | ICD-10-CM

## 2018-02-22 DIAGNOSIS — C3490 Malignant neoplasm of unspecified part of unspecified bronchus or lung: Secondary | ICD-10-CM | POA: Diagnosis not present

## 2018-03-22 DIAGNOSIS — R05 Cough: Secondary | ICD-10-CM

## 2018-03-22 DIAGNOSIS — Z923 Personal history of irradiation: Secondary | ICD-10-CM

## 2018-03-22 DIAGNOSIS — C3490 Malignant neoplasm of unspecified part of unspecified bronchus or lung: Secondary | ICD-10-CM | POA: Diagnosis not present

## 2018-03-22 DIAGNOSIS — C787 Secondary malignant neoplasm of liver and intrahepatic bile duct: Secondary | ICD-10-CM

## 2018-03-22 DIAGNOSIS — R0602 Shortness of breath: Secondary | ICD-10-CM

## 2018-03-22 DIAGNOSIS — C7951 Secondary malignant neoplasm of bone: Secondary | ICD-10-CM

## 2018-04-12 DIAGNOSIS — C7951 Secondary malignant neoplasm of bone: Secondary | ICD-10-CM

## 2018-04-12 DIAGNOSIS — C3490 Malignant neoplasm of unspecified part of unspecified bronchus or lung: Secondary | ICD-10-CM

## 2018-04-12 DIAGNOSIS — Z9221 Personal history of antineoplastic chemotherapy: Secondary | ICD-10-CM

## 2018-04-12 DIAGNOSIS — Z923 Personal history of irradiation: Secondary | ICD-10-CM

## 2018-04-12 DIAGNOSIS — C787 Secondary malignant neoplasm of liver and intrahepatic bile duct: Secondary | ICD-10-CM

## 2018-04-12 DIAGNOSIS — C7931 Secondary malignant neoplasm of brain: Secondary | ICD-10-CM

## 2018-05-11 DIAGNOSIS — C3490 Malignant neoplasm of unspecified part of unspecified bronchus or lung: Secondary | ICD-10-CM | POA: Diagnosis not present

## 2018-05-11 DIAGNOSIS — C7951 Secondary malignant neoplasm of bone: Secondary | ICD-10-CM | POA: Diagnosis not present

## 2018-05-11 DIAGNOSIS — C7931 Secondary malignant neoplasm of brain: Secondary | ICD-10-CM | POA: Diagnosis not present

## 2018-05-11 DIAGNOSIS — C787 Secondary malignant neoplasm of liver and intrahepatic bile duct: Secondary | ICD-10-CM | POA: Diagnosis not present

## 2018-05-11 DIAGNOSIS — Z9221 Personal history of antineoplastic chemotherapy: Secondary | ICD-10-CM

## 2018-05-11 DIAGNOSIS — Z923 Personal history of irradiation: Secondary | ICD-10-CM

## 2018-06-08 DIAGNOSIS — Z9221 Personal history of antineoplastic chemotherapy: Secondary | ICD-10-CM

## 2018-06-08 DIAGNOSIS — C7951 Secondary malignant neoplasm of bone: Secondary | ICD-10-CM

## 2018-06-08 DIAGNOSIS — C3490 Malignant neoplasm of unspecified part of unspecified bronchus or lung: Secondary | ICD-10-CM

## 2018-06-08 DIAGNOSIS — C787 Secondary malignant neoplasm of liver and intrahepatic bile duct: Secondary | ICD-10-CM

## 2018-06-08 DIAGNOSIS — Z923 Personal history of irradiation: Secondary | ICD-10-CM

## 2018-06-08 DIAGNOSIS — D6959 Other secondary thrombocytopenia: Secondary | ICD-10-CM

## 2018-06-08 DIAGNOSIS — C7931 Secondary malignant neoplasm of brain: Secondary | ICD-10-CM

## 2018-06-08 DIAGNOSIS — R530 Neoplastic (malignant) related fatigue: Secondary | ICD-10-CM

## 2018-06-23 DIAGNOSIS — C3490 Malignant neoplasm of unspecified part of unspecified bronchus or lung: Secondary | ICD-10-CM | POA: Diagnosis not present

## 2018-06-23 DIAGNOSIS — C787 Secondary malignant neoplasm of liver and intrahepatic bile duct: Secondary | ICD-10-CM | POA: Diagnosis not present

## 2018-06-23 DIAGNOSIS — J189 Pneumonia, unspecified organism: Secondary | ICD-10-CM

## 2018-06-23 DIAGNOSIS — E872 Acidosis: Secondary | ICD-10-CM | POA: Diagnosis not present

## 2018-06-24 DIAGNOSIS — J188 Other pneumonia, unspecified organism: Secondary | ICD-10-CM

## 2018-06-24 DIAGNOSIS — C3490 Malignant neoplasm of unspecified part of unspecified bronchus or lung: Secondary | ICD-10-CM | POA: Diagnosis not present

## 2018-06-24 DIAGNOSIS — E872 Acidosis: Secondary | ICD-10-CM | POA: Diagnosis not present

## 2018-06-24 DIAGNOSIS — C7951 Secondary malignant neoplasm of bone: Secondary | ICD-10-CM

## 2018-06-24 DIAGNOSIS — R05 Cough: Secondary | ICD-10-CM

## 2018-06-24 DIAGNOSIS — R509 Fever, unspecified: Secondary | ICD-10-CM

## 2018-06-24 DIAGNOSIS — J189 Pneumonia, unspecified organism: Secondary | ICD-10-CM | POA: Diagnosis not present

## 2018-06-24 DIAGNOSIS — C7931 Secondary malignant neoplasm of brain: Secondary | ICD-10-CM | POA: Diagnosis not present

## 2018-06-24 DIAGNOSIS — C787 Secondary malignant neoplasm of liver and intrahepatic bile duct: Secondary | ICD-10-CM | POA: Diagnosis not present

## 2018-06-24 DIAGNOSIS — R112 Nausea with vomiting, unspecified: Secondary | ICD-10-CM

## 2018-06-25 DIAGNOSIS — C787 Secondary malignant neoplasm of liver and intrahepatic bile duct: Secondary | ICD-10-CM | POA: Diagnosis not present

## 2018-06-25 DIAGNOSIS — C3490 Malignant neoplasm of unspecified part of unspecified bronchus or lung: Secondary | ICD-10-CM | POA: Diagnosis not present

## 2018-06-25 DIAGNOSIS — E872 Acidosis: Secondary | ICD-10-CM | POA: Diagnosis not present

## 2018-06-25 DIAGNOSIS — J189 Pneumonia, unspecified organism: Secondary | ICD-10-CM | POA: Diagnosis not present

## 2018-06-25 DIAGNOSIS — C7931 Secondary malignant neoplasm of brain: Secondary | ICD-10-CM | POA: Diagnosis not present

## 2018-06-25 DIAGNOSIS — C7951 Secondary malignant neoplasm of bone: Secondary | ICD-10-CM | POA: Diagnosis not present

## 2018-06-26 DIAGNOSIS — J189 Pneumonia, unspecified organism: Secondary | ICD-10-CM | POA: Diagnosis not present

## 2018-06-26 DIAGNOSIS — E872 Acidosis: Secondary | ICD-10-CM | POA: Diagnosis not present

## 2018-06-26 DIAGNOSIS — C3490 Malignant neoplasm of unspecified part of unspecified bronchus or lung: Secondary | ICD-10-CM | POA: Diagnosis not present

## 2018-06-26 DIAGNOSIS — C787 Secondary malignant neoplasm of liver and intrahepatic bile duct: Secondary | ICD-10-CM | POA: Diagnosis not present

## 2018-07-19 DEATH — deceased

## 2019-03-08 ENCOUNTER — Encounter: Payer: Self-pay | Admitting: Gastroenterology

## 2019-03-15 ENCOUNTER — Telehealth: Payer: Self-pay | Admitting: Gastroenterology

## 2019-03-15 NOTE — Telephone Encounter (Signed)
FYI-Recall letter was sent to this patient-

## 2019-03-15 NOTE — Telephone Encounter (Signed)
Pt's wife called to inform that pt has passed away 07/23/2018 from cancer.  This was Dr. Steve Rattler patient from [place].

## 2019-03-16 NOTE — Telephone Encounter (Signed)
Please tell them that I am truly very sorry. He was a very nice person He will be missed. Rg

## 2019-03-19 NOTE — Telephone Encounter (Signed)
Attempted to reach patient's wife- to give information from Dr. Ezra Sites try again at a later time;

## 2019-03-20 NOTE — Telephone Encounter (Signed)
Called and spoke with patient's wife-patient will need to be taken off of future recalls due to death; Informed patient's wife of message from Dr. Lyndel Safe; patient's wife verbalized she was not upset with the recall letter as she is still receiving mail from other establishments as well;
